# Patient Record
Sex: Male | Born: 1970 | Race: White | Hispanic: No | Marital: Married | Smoking: Never smoker
Health system: Southern US, Community
[De-identification: ages and names within clinical notes are randomized; demographics above are authoritative.]

## PROBLEM LIST (undated history)

## (undated) DIAGNOSIS — G4730 Sleep apnea, unspecified: Secondary | ICD-10-CM

## (undated) DIAGNOSIS — I10 Essential (primary) hypertension: Secondary | ICD-10-CM

---

## 2005-10-24 ENCOUNTER — Emergency Department (HOSPITAL_COMMUNITY): Admission: EM | Admit: 2005-10-24 | Discharge: 2005-10-25 | Payer: Self-pay | Admitting: Emergency Medicine

## 2007-11-05 ENCOUNTER — Emergency Department (HOSPITAL_COMMUNITY): Admission: EM | Admit: 2007-11-05 | Discharge: 2007-11-05 | Payer: Self-pay | Admitting: Family Medicine

## 2009-11-20 ENCOUNTER — Emergency Department (HOSPITAL_COMMUNITY): Admission: EM | Admit: 2009-11-20 | Discharge: 2009-11-20 | Payer: Self-pay | Admitting: Emergency Medicine

## 2011-10-26 ENCOUNTER — Institutional Professional Consult (permissible substitution): Payer: Self-pay | Admitting: Pulmonary Disease

## 2012-03-15 ENCOUNTER — Emergency Department: Payer: Self-pay | Admitting: Emergency Medicine

## 2015-07-29 ENCOUNTER — Encounter (HOSPITAL_BASED_OUTPATIENT_CLINIC_OR_DEPARTMENT_OTHER): Payer: Self-pay

## 2015-07-29 ENCOUNTER — Emergency Department (HOSPITAL_BASED_OUTPATIENT_CLINIC_OR_DEPARTMENT_OTHER)
Admission: EM | Admit: 2015-07-29 | Discharge: 2015-07-29 | Disposition: A | Discharge status: Home / Self Care | Payer: BLUE CROSS/BLUE SHIELD | Attending: Emergency Medicine | Admitting: Emergency Medicine

## 2015-07-29 ENCOUNTER — Emergency Department (HOSPITAL_BASED_OUTPATIENT_CLINIC_OR_DEPARTMENT_OTHER): Payer: BLUE CROSS/BLUE SHIELD

## 2015-07-29 DIAGNOSIS — S3992XA Unspecified injury of lower back, initial encounter: Secondary | ICD-10-CM | POA: Diagnosis present

## 2015-07-29 DIAGNOSIS — X58XXXA Exposure to other specified factors, initial encounter: Secondary | ICD-10-CM | POA: Diagnosis not present

## 2015-07-29 DIAGNOSIS — S46911A Strain of unspecified muscle, fascia and tendon at shoulder and upper arm level, right arm, initial encounter: Secondary | ICD-10-CM | POA: Diagnosis not present

## 2015-07-29 DIAGNOSIS — Y998 Other external cause status: Secondary | ICD-10-CM | POA: Insufficient documentation

## 2015-07-29 DIAGNOSIS — I10 Essential (primary) hypertension: Secondary | ICD-10-CM | POA: Insufficient documentation

## 2015-07-29 DIAGNOSIS — Y9234 Swimming pool (public) as the place of occurrence of the external cause: Secondary | ICD-10-CM | POA: Insufficient documentation

## 2015-07-29 DIAGNOSIS — S29001A Unspecified injury of muscle and tendon of front wall of thorax, initial encounter: Secondary | ICD-10-CM | POA: Diagnosis not present

## 2015-07-29 DIAGNOSIS — Y9389 Activity, other specified: Secondary | ICD-10-CM | POA: Diagnosis not present

## 2015-07-29 DIAGNOSIS — S46811A Strain of other muscles, fascia and tendons at shoulder and upper arm level, right arm, initial encounter: Secondary | ICD-10-CM

## 2015-07-29 MED ORDER — CYCLOBENZAPRINE HCL 5 MG PO TABS: 5.0000 mg | ORAL_TABLET | Freq: Three times a day (TID) | ORAL | Status: DC | PRN

## 2015-07-29 MED ORDER — CYCLOBENZAPRINE HCL 10 MG PO TABS
5.0000 mg | ORAL_TABLET | Freq: Once | ORAL | Status: AC
  Administered 2015-07-29: 5 mg via ORAL
  Filled 2015-07-29: qty 1

## 2015-07-29 NOTE — ED Notes (Signed)
Pt c/o rt upper back pain x2day

## 2015-07-29 NOTE — Discharge Instructions (Signed)
Continue motrin 600 mg every 6 hrs for pain.   Take flexeril for muscle spasms.   Follow up with your doctor regarding your blood pressure.   Return to ER if you have severe pain, shortness of breath.

## 2015-07-29 NOTE — ED Notes (Signed)
MD at bedside. 

## 2015-07-29 NOTE — ED Provider Notes (Signed)
CSN: [id number]     Arrival date & time 07/29/15  1909 History  This chart was scribed for Richardean Canal, MD by Lyndel Safe, ED Scribe. This patient was seen in room MHT13/MHT13 and the patient's care was started 7:48 PM.   Chief Complaint  Patient presents with  . Back Pain   The history is provided by the patient. No language interpreter was used.   HPI Comments: Nicolas May is a 44 y.o. male who presents to the Emergency Department complaining of constant, moderate right-sided mid back pain and rib cage pain onset 2 days ago which he describes as an uncomfortable feeling. He reports 2 days ago he was at the pool playing with his son and tossing him up in the air.  He has taken Motrin with no relief. Denies trouble breathing, SOB, abdominal pain. States that his BP usually runs high at the doctor's office but he is not on blood pressure meds. Denies cardiac problems or CAD.   History reviewed. No pertinent past medical history. History reviewed. No pertinent past surgical history. No family history on file. History  Substance Use Topics  . Smoking status: Never Smoker   . Smokeless tobacco: Not on file  . Alcohol Use: Yes    Review of Systems  Respiratory: Negative for shortness of breath.   Gastrointestinal: Negative for abdominal pain.  Musculoskeletal: Positive for back pain.  All other systems reviewed and are negative.  Allergies  Review of patient's allergies indicates no known allergies.  Home Medications   Prior to Admission medications   Not on File   BP 164/103 mmHg  Pulse 62  Temp(Src) 98.1 F (36.7 C)  Resp 18  Ht 5\' 11"  (1.803 m)  Wt 238 lb (107.956 kg)  BMI 33.21 kg/m2  SpO2 100% Physical Exam  Constitutional: He appears well-developed and well-nourished. No distress.  HENT:  Head: Normocephalic.  Eyes: Conjunctivae are normal. Right eye exhibits no discharge. Left eye exhibits no discharge. No scleral icterus.  Neck: No JVD present.  Cardiovascular:  Normal rate, regular rhythm and normal heart sounds.   Pulmonary/Chest: Effort normal and breath sounds normal. No respiratory distress. He has no wheezes.  Reproducible tenderness R chest and R anterior trapezius area.   Musculoskeletal:  No midline spinal tenderness   Neurological: He is alert. Coordination normal.  Skin: Skin is warm. No rash noted. No erythema. No pallor.  No vesicular rash on chest or back   Psychiatric: He has a normal mood and affect. His behavior is normal.  Nursing note and vitals reviewed.   ED Course  Procedures  DIAGNOSTIC STUDIES: Oxygen Saturation is 100% on RA, normal by my interpretation.    COORDINATION OF CARE: 7:51 PM Discussed treatment plan which includes to order a chest Xray with pt. Pt acknowledges and agrees to plan.   Labs Review Labs Reviewed - No data to display  Imaging Review Dg Chest 2 View  07/29/2015   CLINICAL DATA:  Right rib pain for 2 days. No known injury. Initial encounter.  EXAM: CHEST  2 VIEW  COMPARISON:  None.  FINDINGS: Heart size and mediastinal contours are within normal limits. Both lungs are clear. Visualized skeletal structures are unremarkable.  IMPRESSION: Negative exam.   Electronically Signed   By: Drusilla Kanner M.D.   On: 07/29/2015 20:11     EKG Interpretation None      MDM   Final diagnoses:  None    Nicolas May is a 44 y.o.  male here with R sided back pain, chest pain. Reproducible, likely muscle strain from swimming. Not pleuritic pain so I doubt PE. I doubt ACS. CXR showed no obvious mass. Patient hypertensive likely from pain. Has hx of white coat hypertension. No signs of hypertensive emergency. Will dc home with motrin, flexeril. Will have him see PCP to f/u hypertension.     I personally performed the services described in this documentation, which was scribed in my presence. The recorded information has been reviewed and is accurate.    Richardean Canal, MD 07/29/15 2027

## 2018-04-05 ENCOUNTER — Emergency Department (HOSPITAL_BASED_OUTPATIENT_CLINIC_OR_DEPARTMENT_OTHER)
Admission: EM | Admit: 2018-04-05 | Discharge: 2018-04-06 | Disposition: A | Discharge status: Home / Self Care | Payer: BLUE CROSS/BLUE SHIELD | Attending: Emergency Medicine | Admitting: Emergency Medicine

## 2018-04-05 ENCOUNTER — Encounter (HOSPITAL_BASED_OUTPATIENT_CLINIC_OR_DEPARTMENT_OTHER): Payer: Self-pay | Admitting: *Deleted

## 2018-04-05 ENCOUNTER — Emergency Department (HOSPITAL_BASED_OUTPATIENT_CLINIC_OR_DEPARTMENT_OTHER): Payer: BLUE CROSS/BLUE SHIELD

## 2018-04-05 ENCOUNTER — Other Ambulatory Visit: Payer: Self-pay

## 2018-04-05 DIAGNOSIS — Z79899 Other long term (current) drug therapy: Secondary | ICD-10-CM | POA: Diagnosis not present

## 2018-04-05 DIAGNOSIS — I10 Essential (primary) hypertension: Secondary | ICD-10-CM | POA: Diagnosis not present

## 2018-04-05 DIAGNOSIS — R1032 Left lower quadrant pain: Secondary | ICD-10-CM | POA: Insufficient documentation

## 2018-04-05 DIAGNOSIS — R109 Unspecified abdominal pain: Secondary | ICD-10-CM

## 2018-04-05 HISTORY — DX: Essential (primary) hypertension: I10

## 2018-04-05 HISTORY — DX: Sleep apnea, unspecified: G47.30

## 2018-04-05 NOTE — ED Triage Notes (Signed)
Pt c/o left flank pain x 2 days denies n/v/d

## 2018-04-06 ENCOUNTER — Encounter (HOSPITAL_BASED_OUTPATIENT_CLINIC_OR_DEPARTMENT_OTHER): Payer: Self-pay | Admitting: Emergency Medicine

## 2018-04-06 LAB — HEPATIC FUNCTION PANEL
ALT: 21 U/L (ref 17–63)
AST: 27 U/L (ref 15–41)
Albumin: 4.5 g/dL (ref 3.5–5.0)
Alkaline Phosphatase: 92 U/L (ref 38–126)
Total Bilirubin: 0.7 mg/dL (ref 0.3–1.2)
Total Protein: 7.7 g/dL (ref 6.5–8.1)

## 2018-04-06 LAB — CBC WITH DIFFERENTIAL/PLATELET
BASOS PCT: 0 %
Basophils Absolute: 0 10*3/uL (ref 0.0–0.1)
EOS ABS: 0.3 10*3/uL (ref 0.0–0.7)
Eosinophils Relative: 2 %
HCT: 44.6 % (ref 39.0–52.0)
Hemoglobin: 15.4 g/dL (ref 13.0–17.0)
Lymphocytes Relative: 14 %
Lymphs Abs: 1.8 10*3/uL (ref 0.7–4.0)
MCH: 30 pg (ref 26.0–34.0)
MCHC: 34.5 g/dL (ref 30.0–36.0)
MCV: 86.9 fL (ref 78.0–100.0)
MONO ABS: 1 10*3/uL (ref 0.1–1.0)
Monocytes Relative: 8 %
Neutro Abs: 10.4 10*3/uL — ABNORMAL HIGH (ref 1.7–7.7)
Neutrophils Relative %: 77 %
Platelets: 249 10*3/uL (ref 150–400)
RBC: 5.13 MIL/uL (ref 4.22–5.81)
RDW: 12.5 % (ref 11.5–15.5)
WBC: 13.5 10*3/uL — ABNORMAL HIGH (ref 4.0–10.5)

## 2018-04-06 LAB — URINALYSIS, ROUTINE W REFLEX MICROSCOPIC
BILIRUBIN URINE: NEGATIVE
GLUCOSE, UA: NEGATIVE mg/dL
HGB URINE DIPSTICK: NEGATIVE
KETONES UR: NEGATIVE mg/dL
Leukocytes, UA: NEGATIVE
Nitrite: NEGATIVE
Protein, ur: NEGATIVE mg/dL
Specific Gravity, Urine: <1.005 — ABNORMAL LOW (ref 1.005–1.030)
pH: 7 (ref 5.0–8.0)

## 2018-04-06 LAB — BASIC METABOLIC PANEL
ANION GAP: 10 (ref 5–15)
BUN: 15 mg/dL (ref 6–20)
CO2: 25 mmol/L (ref 22–32)
Calcium: 9.2 mg/dL (ref 8.9–10.3)
Chloride: 100 mmol/L — ABNORMAL LOW (ref 101–111)
Creatinine, Ser: 1 mg/dL (ref 0.61–1.24)
Glucose, Bld: 128 mg/dL — ABNORMAL HIGH (ref 65–99)
Potassium: 3.8 mmol/L (ref 3.5–5.1)
Sodium: 135 mmol/L (ref 135–145)

## 2018-04-06 LAB — LIPASE, BLOOD: LIPASE: 30 U/L (ref 11–51)

## 2018-04-06 MED ORDER — DICLOFENAC SODIUM ER 100 MG PO TB24: 100.0000 mg | ORAL_TABLET | Freq: Every day | ORAL | 0 refills | Status: AC

## 2018-04-06 MED ORDER — KETOROLAC TROMETHAMINE 30 MG/ML IJ SOLN
15.0000 mg | Freq: Once | INTRAMUSCULAR | Status: AC
  Administered 2018-04-06: 15 mg via INTRAVENOUS
  Filled 2018-04-06: qty 1

## 2018-04-06 NOTE — ED Provider Notes (Signed)
[facility] EMERGENCY DEPARTMENT Provider Note   CSN: [id number] Arrival date & time: 04/05/18  2228     History   Chief Complaint Chief Complaint  Patient presents with  . Flank Pain    HPI Nicolas May is a 47 y.o. male.  The history is provided by the patient.  Flank Pain  This is a new problem. The current episode started more than 2 days ago. Episode frequency: intermittently but worse today  The problem has been gradually worsening. Pertinent negatives include no chest pain, no headaches and no shortness of breath. Nothing aggravates the symptoms. Nothing relieves the symptoms. He has tried nothing for the symptoms. The treatment provided no relief.  States it started intermittently several days ago now worse today.  No f/c/r. No cough, no chest pain, no shortness of breath.  It is not made worse by eating or movement or deep breathing.  No n/v/d.    Past Medical History:  Diagnosis Date  . Hypertension   . Sleep apnea     There are no active problems to display for this patient.   History reviewed. No pertinent surgical history.      Home Medications    Prior to Admission medications   Medication Sig Start Date End Date Taking? Authorizing Provider  lisinopril (PRINIVIL,ZESTRIL) 20 MG tablet Take 20 mg by mouth daily.   Yes [provider]  cyclobenzaprine (FLEXERIL) 5 MG tablet Take 1 tablet (5 mg total) by mouth 3 (three) times daily as needed for muscle spasms. 07/29/15   Charlynne Pander, MD  Diclofenac Sodium CR (VOLTAREN-XR) 100 MG 24 hr tablet Take 1 tablet (100 mg total) by mouth daily. 04/06/18   Nyelah Emmerich, MD    Family History History reviewed. No pertinent family history.  Social History Social History   Tobacco Use  . Smoking status: Never Smoker  . Smokeless tobacco: Never Used  Substance Use Topics  . Alcohol use: Yes  . Drug use: Never     Allergies   Patient has no known allergies.   Review of  Systems Review of Systems  Constitutional: Negative for activity change, appetite change, diaphoresis and fever.  HENT: Negative for sore throat and voice change.   Respiratory: Negative for cough, chest tightness and shortness of breath.   Cardiovascular: Negative for chest pain, palpitations and leg swelling.  Gastrointestinal: Negative for anal bleeding, blood in stool, constipation, diarrhea, nausea, rectal pain and vomiting.  Genitourinary: Positive for flank pain. Negative for dysuria.  Neurological: Negative for speech difficulty, weakness and headaches.  All other systems reviewed and are negative.    Physical Exam Updated Vital Signs BP 124/63 (BP Location: Left Arm)   Pulse 75   Temp 97.8 F (36.6 C) (Oral)   Resp 16   Ht 5\' 11"  (1.803 m)   Wt 97.5 kg (215 lb)   SpO2 98%   BMI 29.99 kg/m   Physical Exam  Constitutional: He is oriented to person, place, and time. He appears well-developed and well-nourished. No distress.  HENT:  Head: Normocephalic and atraumatic.  Mouth/Throat: No oropharyngeal exudate.  Eyes: Pupils are equal, round, and reactive to light. Conjunctivae are normal.  Neck: Normal range of motion. Neck supple.  Cardiovascular: Normal rate, regular rhythm, normal heart sounds and intact distal pulses.  Pulmonary/Chest: Effort normal and breath sounds normal. No stridor. He has no wheezes. He has no rales. He exhibits no tenderness.  Abdominal: Soft. He exhibits no mass. There is no  hepatosplenomegaly. There is no tenderness. There is no rigidity, no rebound, no guarding, no CVA tenderness, no tenderness at McBurney's point and negative Murphy's sign.    Musculoskeletal: Normal range of motion. He exhibits no tenderness.  Neurological: He is alert and oriented to person, place, and time.  Skin: Skin is warm and dry. Capillary refill takes less than 2 seconds.  Psychiatric: His mood appears anxious.     ED Treatments / Results  Labs (all labs  ordered are listed, but only abnormal results are displayed) Results for orders placed or performed during the hospital encounter of 04/05/18  Urinalysis, Routine w reflex microscopic  Result Value Ref Range   Color, Urine YELLOW YELLOW   APPearance CLEAR CLEAR   Specific Gravity, Urine <1.005 (L) 1.005 - 1.030   pH 7.0 5.0 - 8.0   Glucose, UA NEGATIVE NEGATIVE mg/dL   Hgb urine dipstick NEGATIVE NEGATIVE   Bilirubin Urine NEGATIVE NEGATIVE   Ketones, ur NEGATIVE NEGATIVE mg/dL   Protein, ur NEGATIVE NEGATIVE mg/dL   Nitrite NEGATIVE NEGATIVE   Leukocytes, UA NEGATIVE NEGATIVE  CBC with Differential/Platelet  Result Value Ref Range   WBC 13.5 (H) 4.0 - 10.5 K/uL   RBC 5.13 4.22 - 5.81 MIL/uL   Hemoglobin 15.4 13.0 - 17.0 g/dL   HCT 69.644.6 29.539.0 - 28.452.0 %   MCV 86.9 78.0 - 100.0 fL   MCH 30.0 26.0 - 34.0 pg   MCHC 34.5 30.0 - 36.0 g/dL   RDW 13.212.5 44.011.5 - 10.215.5 %   Platelets 249 150 - 400 K/uL   Neutrophils Relative % 77 %   Neutro Abs 10.4 (H) 1.7 - 7.7 K/uL   Lymphocytes Relative 14 %   Lymphs Abs 1.8 0.7 - 4.0 K/uL   Monocytes Relative 8 %   Monocytes Absolute 1.0 0.1 - 1.0 K/uL   Eosinophils Relative 2 %   Eosinophils Absolute 0.3 0.0 - 0.7 K/uL   Basophils Relative 0 %   Basophils Absolute 0.0 0.0 - 0.1 K/uL  Basic metabolic panel  Result Value Ref Range   Sodium 135 135 - 145 mmol/L   Potassium 3.8 3.5 - 5.1 mmol/L   Chloride 100 (L) 101 - 111 mmol/L   CO2 25 22 - 32 mmol/L   Glucose, Bld 128 (H) 65 - 99 mg/dL   BUN 15 6 - 20 mg/dL   Creatinine, Ser 7.251.00 0.61 - 1.24 mg/dL   Calcium 9.2 8.9 - 36.610.3 mg/dL   GFR calc non Af Amer >60 >60 mL/min   GFR calc Af Amer >60 >60 mL/min   Anion gap 10 5 - 15  Hepatic function panel  Result Value Ref Range   Total Protein 7.7 6.5 - 8.1 g/dL   Albumin 4.5 3.5 - 5.0 g/dL   AST 27 15 - 41 U/L   ALT 21 17 - 63 U/L   Alkaline Phosphatase 92 38 - 126 U/L   Total Bilirubin 0.7 0.3 - 1.2 mg/dL   Bilirubin, Direct <4.4<0.1 (L) 0.1 - 0.5  mg/dL   Indirect Bilirubin NOT CALCULATED 0.3 - 0.9 mg/dL  Lipase, blood  Result Value Ref Range   Lipase 30 11 - 51 U/L   Ct Renal Stone Study  Result Date: 04/06/2018 CLINICAL DATA:  Initial evaluation for acute left flank pain. Sue sleeve widely patent flow King aortic arch inflamed EXAM: CT ABDOMEN AND PELVIS WITHOUT CONTRAST TECHNIQUE: Multidetector CT imaging of the abdomen and pelvis was performed following the standard protocol without IV  contrast. COMPARISON:  None. FINDINGS: Lower chest: Mild scattered subsegmental atelectatic changes noted within the visualized lungs. 5 mm right middle lobe nodule noted (series 4, image 11), indeterminate. Visualized lungs are otherwise clear. Hepatobiliary: Limited noncontrast evaluation of the liver is unremarkable. Gallbladder within normal limits. No biliary dilatation. Pancreas: There is mild hazy fat stranding about the posterior pancreatic body, which may reflect sequelae of acute pancreatitis (series 3, image 49). Pancreas otherwise within normal limits. No abnormal pancreatic ductal dilatation. Spleen: Spleen within normal limits. Adrenals/Urinary Tract: Adrenal glands are normal. Kidneys equal in size without evidence for nephrolithiasis or hydronephrosis. No radiopaque calculi seen along the course of either renal collecting system. No hydroureter. 18 mm exophytic cyst noted extending from the lower pole of the right kidney. Bladder within normal limits. No layering stones within the bladder lumen. Stomach/Bowel: Stomach within normal limits. No evidence for bowel obstruction. Appendix normal. No acute inflammatory changes seen about the bowels. Vascular/Lymphatic: Intra-abdominal aorta of normal caliber. No pathologically enlarged intra-abdominal or pelvic lymph nodes. Reproductive: Prostate normal. Other: No free air or fluid. Musculoskeletal: No acute osseus abnormality. No worrisome lytic or blastic osseous lesions. 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 IMPRESSION: 1. Mild hazy inflammatory stranding about the posterior pancreatic body, suspicious for possible acute pancreatitis. Correlation with serum lipase recommended. 2. No other acute intra-abdominal or pelvic process identified. 3. No CT evidence for nephrolithiasis or obstructive uropathy. Electronically Signed   By: Rise Mu M.D.   On: 04/06/2018 00:29    Radiology Ct Renal Stone Study  Result Date: 04/06/2018 CLINICAL DATA:  Initial evaluation for acute left flank pain. Sue sleeve widely patent flow King aortic arch inflamed EXAM: CT ABDOMEN AND PELVIS WITHOUT CONTRAST TECHNIQUE: Multidetector CT imaging of the abdomen and pelvis was performed following the standard protocol without IV contrast. COMPARISON:  None. FINDINGS: Lower chest: Mild scattered subsegmental atelectatic changes noted within the visualized lungs. 5 mm right middle lobe nodule noted (series 4, image 11), indeterminate. Visualized lungs are otherwise clear. Hepatobiliary: Limited noncontrast evaluation of the liver is unremarkable. Gallbladder within normal limits. No biliary dilatation. Pancreas: There is mild hazy fat stranding about the posterior pancreatic body, which may reflect sequelae of acute pancreatitis (series 3, image 49). Pancreas otherwise within normal limits. No abnormal pancreatic ductal dilatation. Spleen: Spleen within normal limits. Adrenals/Urinary Tract: Adrenal glands are normal. Kidneys equal in size without evidence for nephrolithiasis or hydronephrosis. No radiopaque calculi seen along the course of either renal collecting system. No hydroureter. 18 mm exophytic cyst noted extending from the lower pole of the right kidney. Bladder within normal limits. No layering stones within the bladder lumen. Stomach/Bowel: Stomach within normal limits. No evidence for bowel obstruction. Appendix normal. No acute  inflammatory changes seen about the bowels. Vascular/Lymphatic: Intra-abdominal aorta of normal caliber. No pathologically enlarged intra-abdominal or pelvic lymph nodes. Reproductive: Prostate normal. Other: No free air or fluid. Musculoskeletal: No acute osseus abnormality. No worrisome lytic or blastic osseous lesions. Unilateral left-sided pars defect noted at L5. 5 cm intramuscular lipoma noted within the posterior right upper thigh. IMPRESSION: 1. Mild hazy inflammatory stranding about the posterior pancreatic body, suspicious for possible acute pancreatitis. Correlation with serum lipase recommended. 2. No other acute intra-abdominal or pelvic process identified. 3. No CT evidence for nephrolithiasis or obstructive uropathy. Electronically Signed   By: Rise Mu M.D.   On: 04/06/2018 00:29    Procedures Procedures (  including critical care time)  Medications Ordered in ED Medications  ketorolac (TORADOL) 30 MG/ML injection 15 mg (15 mg Intravenous Given 04/06/18 0046)     I suspect this is MSK pain as it seem to be in the area of the obliques.  He states sometimes movement helps.  It is not in the thoracic cavity.  It is not worse with inspiration.  He has no cough or hemoptysis.  No leg pain.  No long travel.  No n/v/d.  He is non tender on exam with normal vital signs.    PERC negative wells 0, very highly doubt PE as the pain is low and the symptoms are in the abdomen.    Meals do not affect it and the patient endorses a low fat diet.    I have had a long discussion with the patient regarding the CT scan report and stated to the patient that while there was a concern for trace inflammation near the tail of the pancreas his blood work did not show signs of this and the pain is not consistent with this.    I reassured the patient multiple times that all lifethreatening conditions had been excluded with labs and CT scan. We give a trial of NSAIDs for pain and I encouraged the  patient to continue his healthy and active life style.      Final Clinical Impressions(s) / ED Diagnoses   Final diagnoses:  Flank pain   Likely MSK in nature. Recheck with your family doctor this week.  Return for fevers >100.4 unrelieved by medication, shortness of breath, chest pain, hemoptysis, intractable vomiting, or diarrhea, abdominal pain, Inability to tolerate liquids or food, cough, altered mental status or any concerns. No signs of systemic illness or infection. The patient is nontoxic-appearing on exam and vital signs are within normal limits.   I have reviewed the triage vital signs and the nursing notes. Pertinent labs &imaging results that were available during my care of the patient were reviewed by me and considered in my medical decision making (see chart for details).  After history, exam, and medical workup I feel the patient has been appropriately medically screened and is safe for discharge home. Pertinent diagnoses were discussed with the patient. Patient was given return precautions.   ED Discharge Orders        Ordered    Diclofenac Sodium CR (VOLTAREN-XR) 100 MG 24 hr tablet  Daily     04/06/18 0149       Aerik Polan, MD 04/06/18 0209

## 2019-12-23 IMAGING — CT CT RENAL STONE PROTOCOL
2 of 4 series · 16 of 46 positions shown, 18 images · non-contrast
Comparison: None.

CLINICAL DATA: Initial evaluation for acute left flank pain. Tiger
sleeve widely patent flow Matayoshi aortic arch inflamed

EXAM:
CT ABDOMEN AND PELVIS WITHOUT CONTRAST
TECHNIQUE: Multidetector CT imaging of the abdomen and pelvis was performed
following the standard protocol without IV contrast.

[Series 3: axial st · axial · 0.84mm/px · z∈[-492,+52]mm · 13 of 121 slices shown, 15 images]
[im 6/121  soft-tissue]
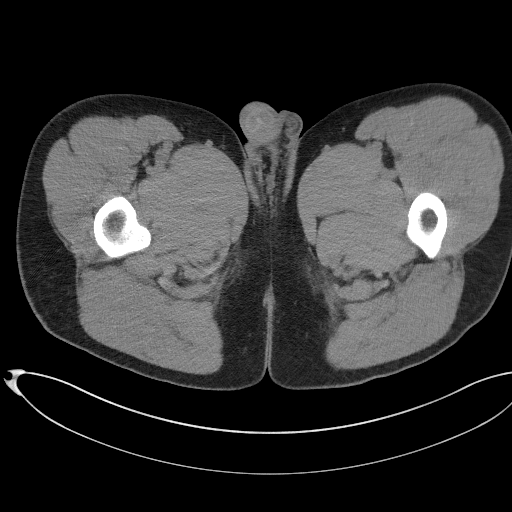
[im 6/121  bone]
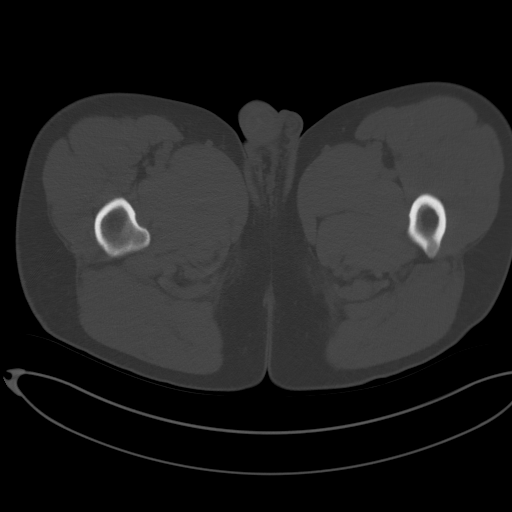
[im 16/121  soft-tissue]
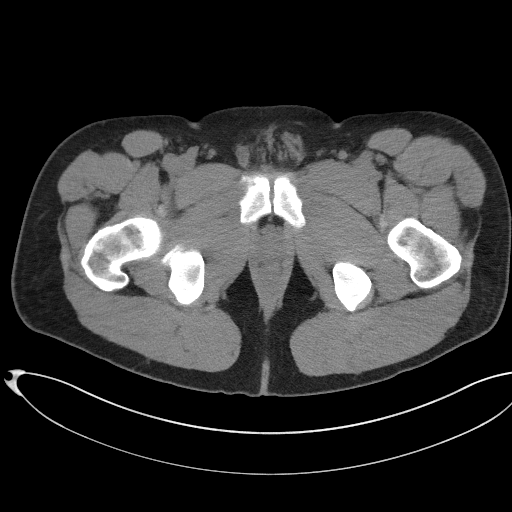
[im 27/121  soft-tissue]
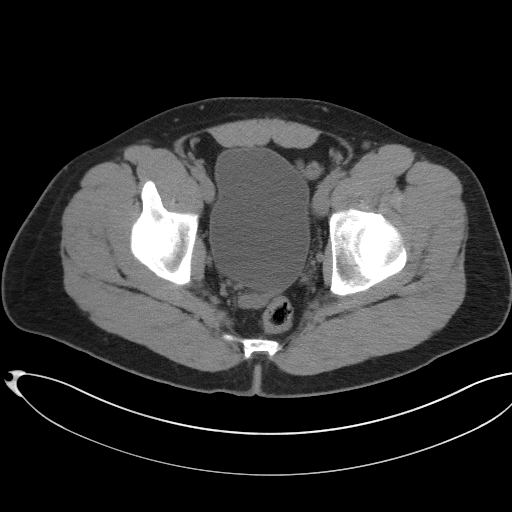
[im 32/121  soft-tissue]
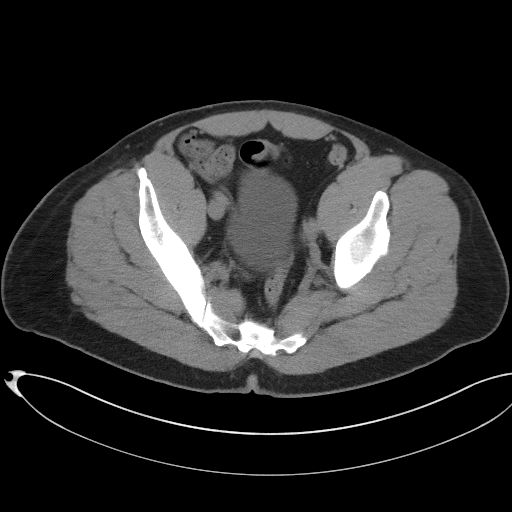
[im 42/121  soft-tissue]
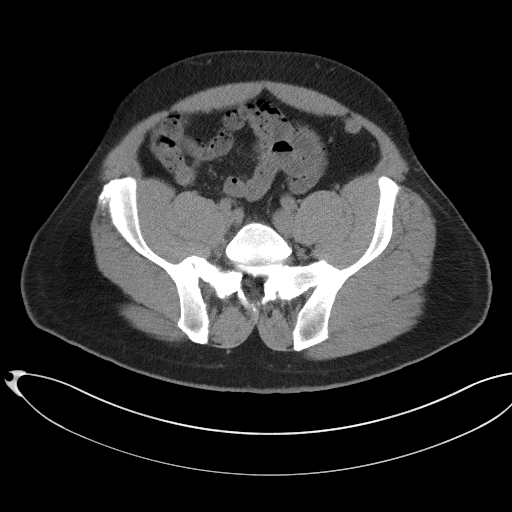
[im 53/121  soft-tissue]
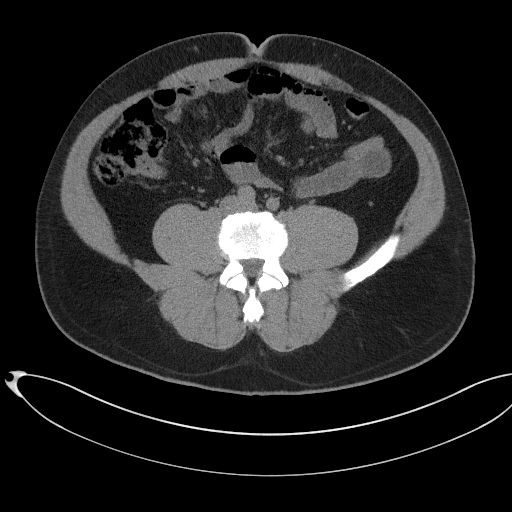
[im 63/121  soft-tissue]
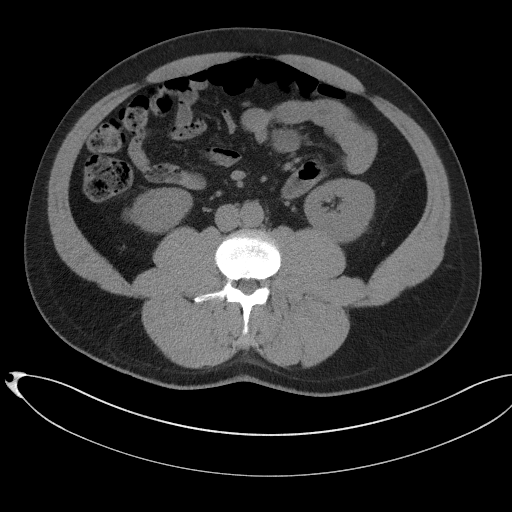
[im 68/121  soft-tissue]
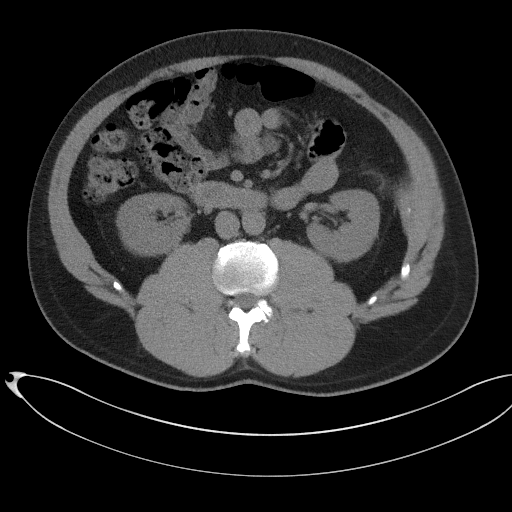
[im 79/121  soft-tissue]
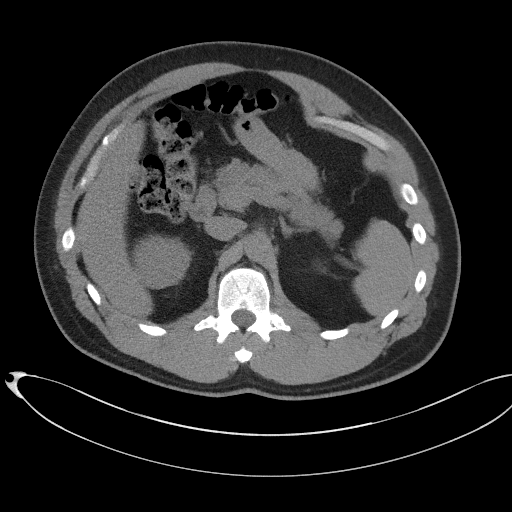
[im 79/121  bone]
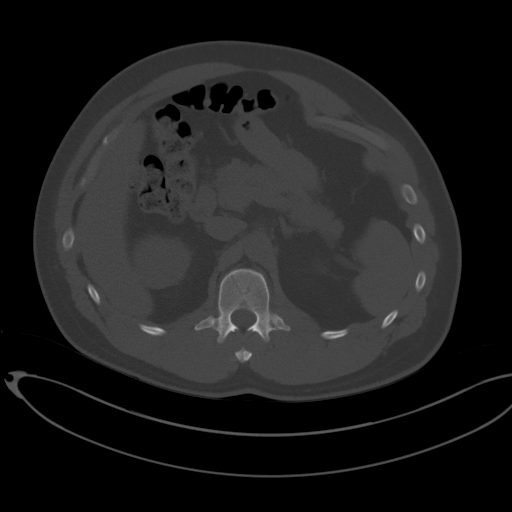
[im 89/121  soft-tissue]
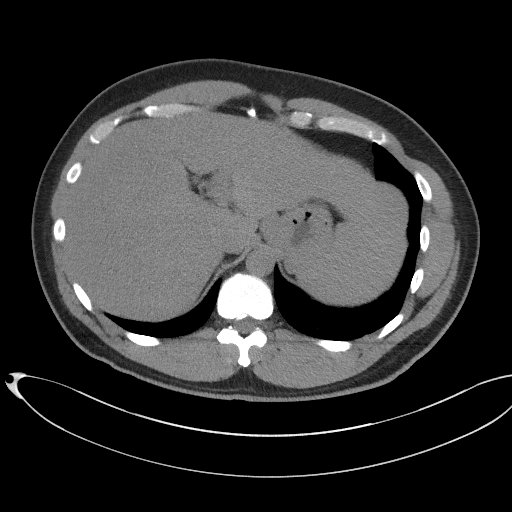
[im 94/121  soft-tissue]
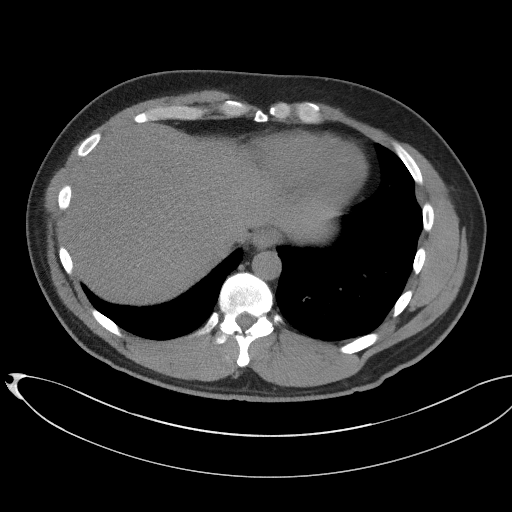
[im 105/121  soft-tissue]
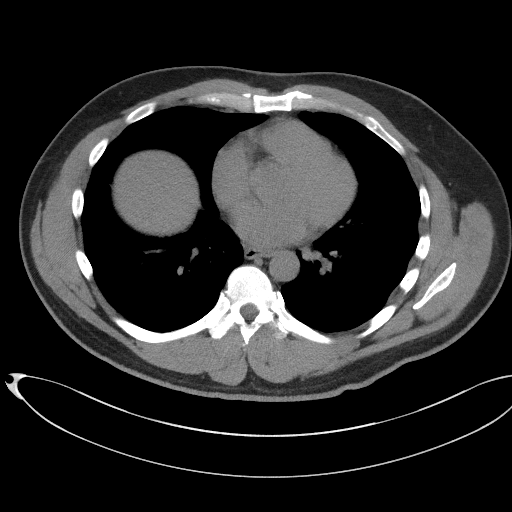
[im 115/121  soft-tissue]
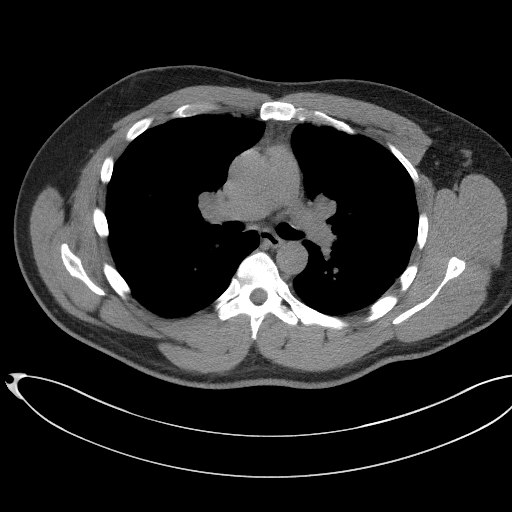

[Series 5: coronal st · coronal · 1.01mm/px · 3 of 96 slices shown]
[im 32/96  soft-tissue]
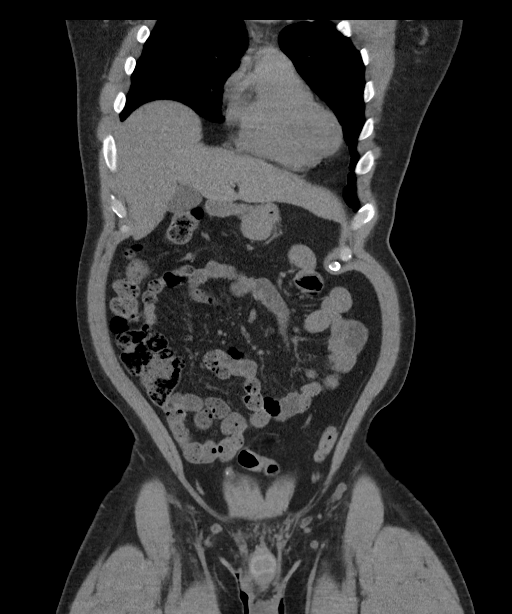
[im 43/96  soft-tissue]
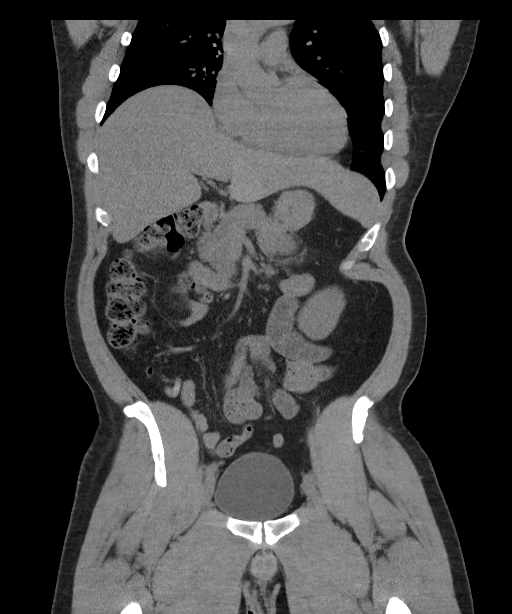
[im 53/96  soft-tissue]
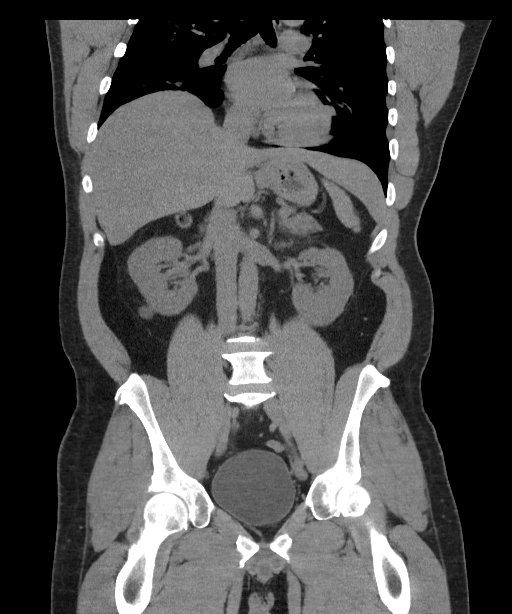

[16 of 46 positions shown; findings below may reference images not displayed]

FINDINGS: Lower chest: Mild scattered subsegmental atelectatic changes noted
within the visualized lungs. 5 mm right middle lobe nodule noted
(series 4, image 11), indeterminate. Visualized lungs are otherwise
clear.

Hepatobiliary: Limited noncontrast evaluation of the liver is
unremarkable. Gallbladder within normal limits. No biliary
dilatation.

Pancreas: There is mild hazy fat stranding about the posterior
pancreatic body, which may reflect sequelae of acute pancreatitis
(series 3, image 49). Pancreas otherwise within normal limits. No
abnormal pancreatic ductal dilatation.

Spleen: Spleen within normal limits.

Adrenals/Urinary Tract: Adrenal glands are normal. Kidneys equal in
size without evidence for nephrolithiasis or hydronephrosis. No
radiopaque calculi seen along the course of either renal collecting
system. No hydroureter. 18 mm exophytic cyst noted extending from
the lower pole of the right kidney. Bladder within normal limits. No
layering stones within the bladder lumen.

Stomach/Bowel: Stomach within normal limits. No evidence for bowel
obstruction. Appendix normal. No acute inflammatory changes seen
about the bowels.

Vascular/Lymphatic: Intra-abdominal aorta of normal caliber. No
pathologically enlarged intra-abdominal or pelvic lymph nodes.

Reproductive: Prostate normal.

Other: No free air or fluid.

Musculoskeletal: No acute osseus abnormality. No worrisome lytic or
blastic osseous lesions. Unilateral left-sided pars defect noted at
L5. 5 cm intramuscular lipoma noted within the posterior right upper
thigh.
IMPRESSION: 1. Mild hazy inflammatory stranding about the posterior pancreatic
body, suspicious for possible acute pancreatitis. Correlation with
serum lipase recommended.
2. No other acute intra-abdominal or pelvic process identified.
3. No CT evidence for nephrolithiasis or obstructive uropathy.

## 2020-01-29 ENCOUNTER — Emergency Department (HOSPITAL_BASED_OUTPATIENT_CLINIC_OR_DEPARTMENT_OTHER): Payer: BLUE CROSS/BLUE SHIELD

## 2020-01-29 ENCOUNTER — Encounter (HOSPITAL_BASED_OUTPATIENT_CLINIC_OR_DEPARTMENT_OTHER): Payer: Self-pay | Admitting: *Deleted

## 2020-01-29 ENCOUNTER — Emergency Department (HOSPITAL_BASED_OUTPATIENT_CLINIC_OR_DEPARTMENT_OTHER)
Admission: EM | Admit: 2020-01-29 | Discharge: 2020-01-29 | Disposition: A | Discharge status: Home / Self Care | Payer: BLUE CROSS/BLUE SHIELD | Attending: Emergency Medicine | Admitting: Emergency Medicine

## 2020-01-29 ENCOUNTER — Other Ambulatory Visit: Payer: Self-pay

## 2020-01-29 DIAGNOSIS — I10 Essential (primary) hypertension: Secondary | ICD-10-CM | POA: Insufficient documentation

## 2020-01-29 DIAGNOSIS — R072 Precordial pain: Secondary | ICD-10-CM | POA: Insufficient documentation

## 2020-01-29 DIAGNOSIS — R0789 Other chest pain: Secondary | ICD-10-CM | POA: Diagnosis present

## 2020-01-29 LAB — COMPREHENSIVE METABOLIC PANEL
ALT: 30 U/L (ref 0–44)
AST: 26 U/L (ref 15–41)
Albumin: 4.5 g/dL (ref 3.5–5.0)
Alkaline Phosphatase: 56 U/L (ref 38–126)
Anion gap: 8 (ref 5–15)
BUN: 17 mg/dL (ref 6–20)
CO2: 28 mmol/L (ref 22–32)
Calcium: 9.4 mg/dL (ref 8.9–10.3)
Chloride: 102 mmol/L (ref 98–111)
Creatinine, Ser: 1.13 mg/dL (ref 0.61–1.24)
GFR calc Af Amer: >60 mL/min (ref >60)
GFR calc non Af Amer: >60 mL/min (ref >60)
Glucose, Bld: 108 mg/dL — ABNORMAL HIGH (ref 70–99)
Potassium: 3.5 mmol/L (ref 3.5–5.1)
Sodium: 138 mmol/L (ref 135–145)
Total Bilirubin: 1.2 mg/dL (ref 0.3–1.2)
Total Protein: 7.6 g/dL (ref 6.5–8.1)

## 2020-01-29 LAB — TROPONIN I (HIGH SENSITIVITY)
Troponin I (High Sensitivity): 5 ng/L (ref <18)
Troponin I (High Sensitivity): 5 ng/L (ref <18)

## 2020-01-29 LAB — CBC WITH DIFFERENTIAL/PLATELET
Abs Immature Granulocytes: 0.1 10*3/uL — ABNORMAL HIGH (ref 0.00–0.07)
Basophils Absolute: 0.1 10*3/uL (ref 0.0–0.1)
Basophils Relative: 0 %
Eosinophils Absolute: 0.3 10*3/uL (ref 0.0–0.5)
Eosinophils Relative: 2 %
HCT: 47.9 % (ref 39.0–52.0)
Hemoglobin: 16 g/dL (ref 13.0–17.0)
Immature Granulocytes: 1 %
Lymphocytes Relative: 21 %
Lymphs Abs: 2.5 10*3/uL (ref 0.7–4.0)
MCH: 29.6 pg (ref 26.0–34.0)
MCHC: 33.4 g/dL (ref 30.0–36.0)
MCV: 88.7 fL (ref 80.0–100.0)
Monocytes Absolute: 0.9 10*3/uL (ref 0.1–1.0)
Monocytes Relative: 7 %
Neutro Abs: 8.4 10*3/uL — ABNORMAL HIGH (ref 1.7–7.7)
Neutrophils Relative %: 69 %
Platelets: 262 10*3/uL (ref 150–400)
RBC: 5.4 MIL/uL (ref 4.22–5.81)
RDW: 12.1 % (ref 11.5–15.5)
WBC: 12.2 10*3/uL — ABNORMAL HIGH (ref 4.0–10.5)
nRBC: 0 % (ref 0.0–0.2)

## 2020-01-29 NOTE — ED Provider Notes (Addendum)
[facility] EMERGENCY DEPARTMENT Provider Note   CSN: [id number] Arrival date & time: 01/29/20  1737     History Chief Complaint  Patient presents with  . Chest Pain    Nicolas May is a 49 y.o. male.  Patient usually takes long walks every morning has not had any difficulty.  But starting yesterday has had some intermittent on and off chest discomfort substernal area lasting less than 15 minutes.  At most 10 minutes.  Does not seem to be exertional not made worse or better by anything else.  No significant shortness of breath no nausea or vomiting no leg swelling.  Past medical history since significant for sleep apnea and hypertension.  No known history of any heart problems.  No chest pain now.        Past Medical History:  Diagnosis Date  . Hypertension   . Sleep apnea     There are no problems to display for this patient.   History reviewed. No pertinent surgical history.     No family history on file.  Social History   Tobacco Use  . Smoking status: Never Smoker  . Smokeless tobacco: Never Used  Substance Use Topics  . Alcohol use: Yes  . Drug use: Never    Home Medications Prior to Admission medications   Medication Sig Start Date End Date Taking? Authorizing Provider  lisinopril (PRINIVIL,ZESTRIL) 20 MG tablet Take 20 mg by mouth daily.   Yes [provider]  cyclobenzaprine (FLEXERIL) 5 MG tablet Take 1 tablet (5 mg total) by mouth 3 (three) times daily as needed for muscle spasms. 07/29/15   Charlynne Pander, MD  Diclofenac Sodium CR (VOLTAREN-XR) 100 MG 24 hr tablet Take 1 tablet (100 mg total) by mouth daily. 04/06/18   Palumbo, April, MD    Allergies    Patient has no known allergies.  Review of Systems   Review of Systems  Constitutional: Negative for chills and fever.  HENT: Negative for congestion, rhinorrhea and sore throat.   Eyes: Negative for visual disturbance.  Respiratory: Negative for cough and shortness of breath.    Cardiovascular: Positive for chest pain. Negative for leg swelling.  Gastrointestinal: Negative for abdominal pain, diarrhea, nausea and vomiting.  Genitourinary: Negative for dysuria.  Musculoskeletal: Negative for back pain and neck pain.  Skin: Negative for rash.  Neurological: Negative for dizziness, light-headedness and headaches.  Hematological: Does not bruise/bleed easily.  Psychiatric/Behavioral: Negative for confusion.    Physical Exam Updated Vital Signs BP 124/73 (BP Location: Left Arm)   Pulse 72   Temp 98 F (36.7 C) (Oral)   Resp 14   Ht 1.803 m (5\' 11" )   Wt 104.3 kg   SpO2 97%   BMI 32.08 kg/m   Physical Exam Vitals and nursing note reviewed.  Constitutional:      Appearance: Normal appearance. He is well-developed.  HENT:     Head: Normocephalic and atraumatic.  Eyes:     Conjunctiva/sclera: Conjunctivae normal.     Pupils: Pupils are equal, round, and reactive to light.  Cardiovascular:     Rate and Rhythm: Normal rate and regular rhythm.     Heart sounds: No murmur.  Pulmonary:     Effort: Pulmonary effort is normal. No respiratory distress.     Breath sounds: Normal breath sounds.  Abdominal:     Palpations: Abdomen is soft.     Tenderness: There is no abdominal tenderness.  Musculoskeletal:  General: Normal range of motion.     Cervical back: Normal range of motion and neck supple.  Skin:    General: Skin is warm and dry.  Neurological:     General: No focal deficit present.     Mental Status: He is alert and oriented to person, place, and time.     ED Results / Procedures / Treatments   Labs (all labs ordered are listed, but only abnormal results are displayed) Labs Reviewed  COMPREHENSIVE METABOLIC PANEL - Abnormal; Notable for the following components:      Result Value   Glucose, Bld 108 (*)    All other components within normal limits  CBC WITH DIFFERENTIAL/PLATELET - Abnormal; Notable for the following components:   WBC  12.2 (*)    Neutro Abs 8.4 (*)    Abs Immature Granulocytes 0.10 (*)    All other components within normal limits  TROPONIN I (HIGH SENSITIVITY)  TROPONIN I (HIGH SENSITIVITY)    EKG EKG Interpretation  Date/Time:  Monday January 29 2020 17:40:42 EST Ventricular Rate:  80 PR Interval:  144 QRS Duration: 96 QT Interval:  376 QTC Calculation: 433 R Axis:   44 Text Interpretation: Normal sinus rhythm Normal ECG Confirmed by Fredia Sorrow [id number] on 01/29/2020 5:45:55 PM   Radiology DG Chest Port 1 View  Result Date: 01/29/2020 CLINICAL DATA:  49 year old male with central chest pain. EXAM: PORTABLE CHEST 1 VIEW COMPARISON:  Chest radiograph dated 07/29/2015. FINDINGS: The heart size and mediastinal contours are within normal limits. Both lungs are clear. The visualized skeletal structures are unremarkable. IMPRESSION: No active disease. Electronically Signed   By: Anner Crete M.D.   On: 01/29/2020 18:07    Procedures Procedures (including critical care time)  Medications Ordered in ED Medications - No data to display  ED Course  I have reviewed the triage vital signs and the nursing notes.  Pertinent labs & imaging results that were available during my care of the patient were reviewed by me and considered in my medical decision making (see chart for details).    MDM Rules/Calculators/A&P                      Work-up for the intermittent chest pain which is all been less than 10 minutes that started yesterday.  Without any acute findings chest x-ray negative troponins x2 -.  Would recommend patient start baby aspirin a day follow-up with primary care doctor precautions provided to return for any new or worse symptoms or chest pain lasting 15 minutes or longer.      Final Clinical Impression(s) / ED Diagnoses Final diagnoses:  Precordial pain    Rx / DC Orders ED Discharge Orders    None       Fredia Sorrow, MD 01/29/20 2044    Fredia Sorrow,  MD 01/29/20 2045

## 2020-01-29 NOTE — Discharge Instructions (Addendum)
Return for worse chest pain lasting 15 minutes or longer.  Otherwise make an appointment to follow-up with your doctor.  Today's evaluation without any acute findings.  Would recommend a baby aspirin a day.

## 2020-01-29 NOTE — ED Triage Notes (Signed)
Chest pain in the center of his chest on and off since yesterday.

## 2020-11-23 ENCOUNTER — Ambulatory Visit: Payer: BLUE CROSS/BLUE SHIELD | Attending: Internal Medicine

## 2020-11-23 DIAGNOSIS — Z23 Encounter for immunization: Secondary | ICD-10-CM

## 2020-11-23 NOTE — Progress Notes (Signed)
   Covid-19 Vaccination [facility]  Name:  Nicolas May    MRN: [id number] DOB: 1971/10/07  11/23/2020  Mr. Nicolas May was observed post Covid-19 immunization for 15 minutes without incident. He was provided with Vaccine Information Sheet and instruction to access the V-Safe system.   Mr. Nicolas May was instructed to call 911 with any severe reactions post vaccine: Marland Kitchen Difficulty breathing  . Swelling of face and throat  . A fast heartbeat  . A bad rash all over body  . Dizziness and weakness   Immunizations Administered    Name Date Dose VIS Date Route   Pfizer COVID-19 Vaccine 11/23/2020 12:33 PM 0.3 mL 10/09/2020 Intramuscular   Manufacturer: [employer], [employer]   Lot: [id number]   NDC: [address]-6

## 2020-12-06 ENCOUNTER — Ambulatory Visit: Payer: Self-pay

## 2021-10-16 IMAGING — DX DG CHEST 1V PORT
2 series · 2 of 2 positions shown · non-contrast
Comparison: Chest radiograph dated 07/29/2015.

CLINICAL DATA: 48-year-old male with central chest pain.

EXAM:
PORTABLE CHEST 1 VIEW

[chest ap (1 of 2)]
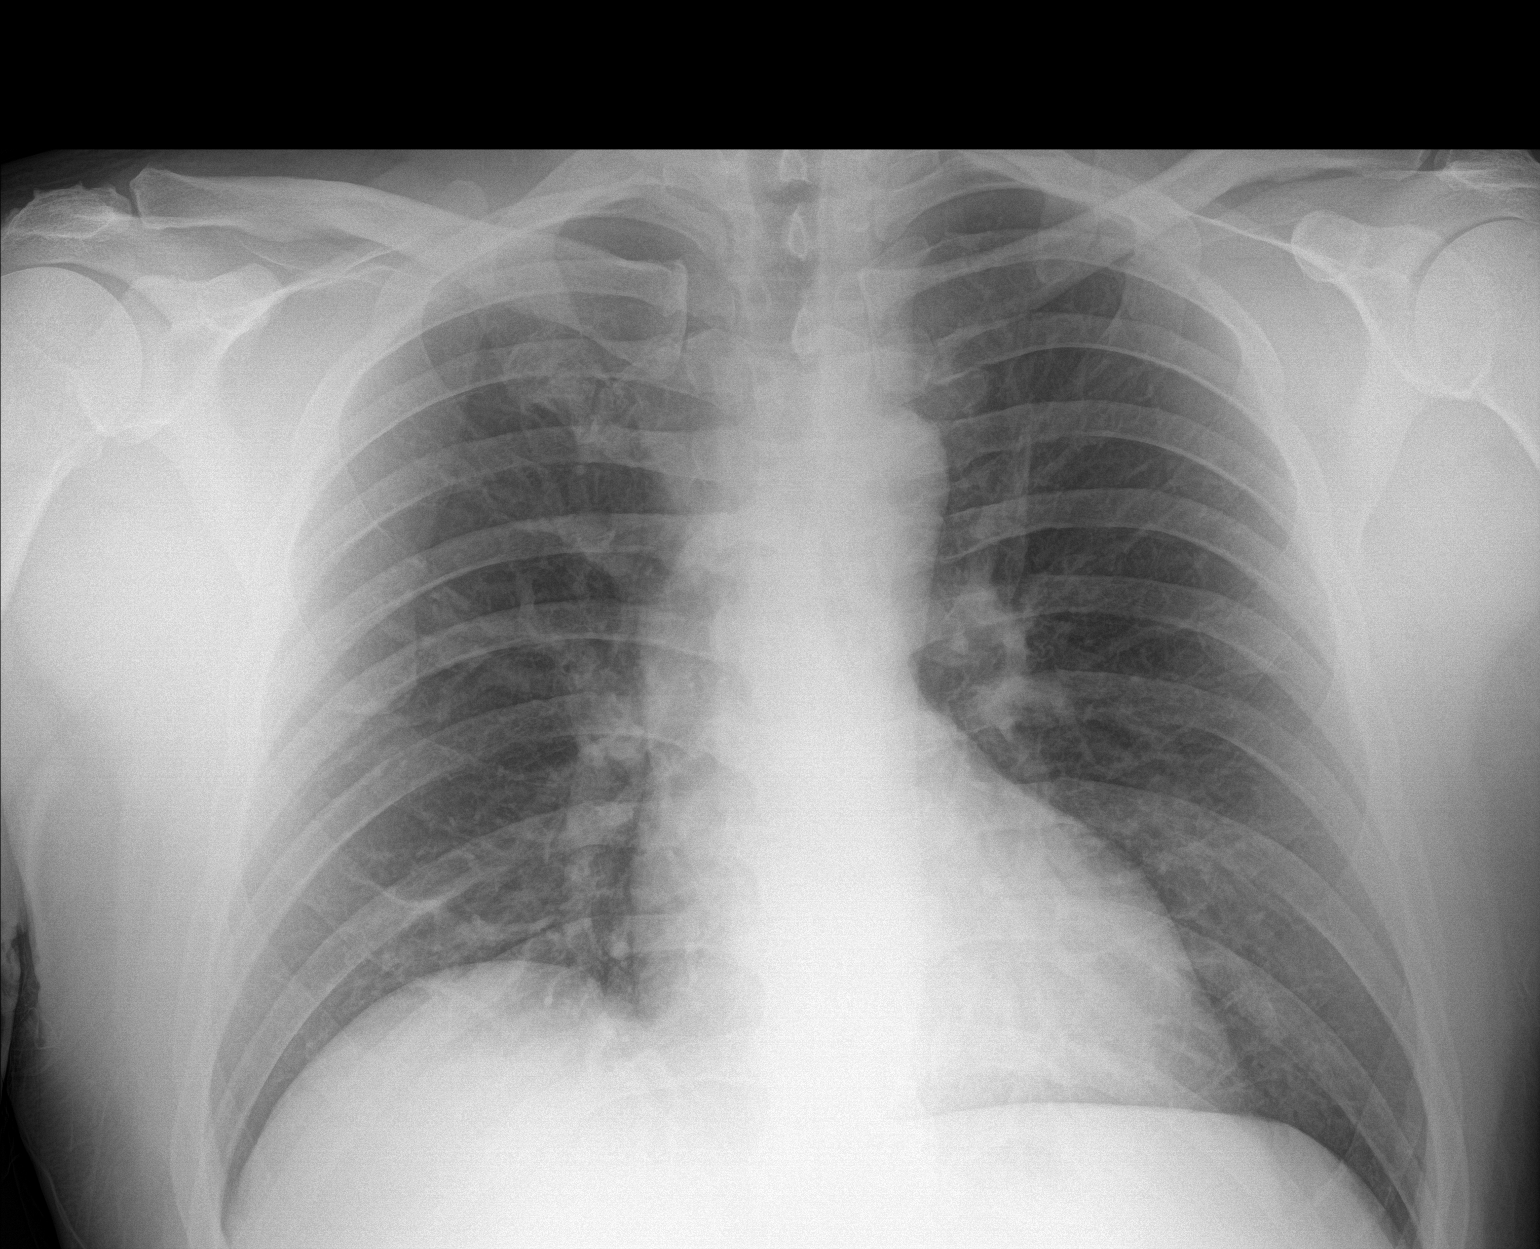

[chest ap (2 of 2)]
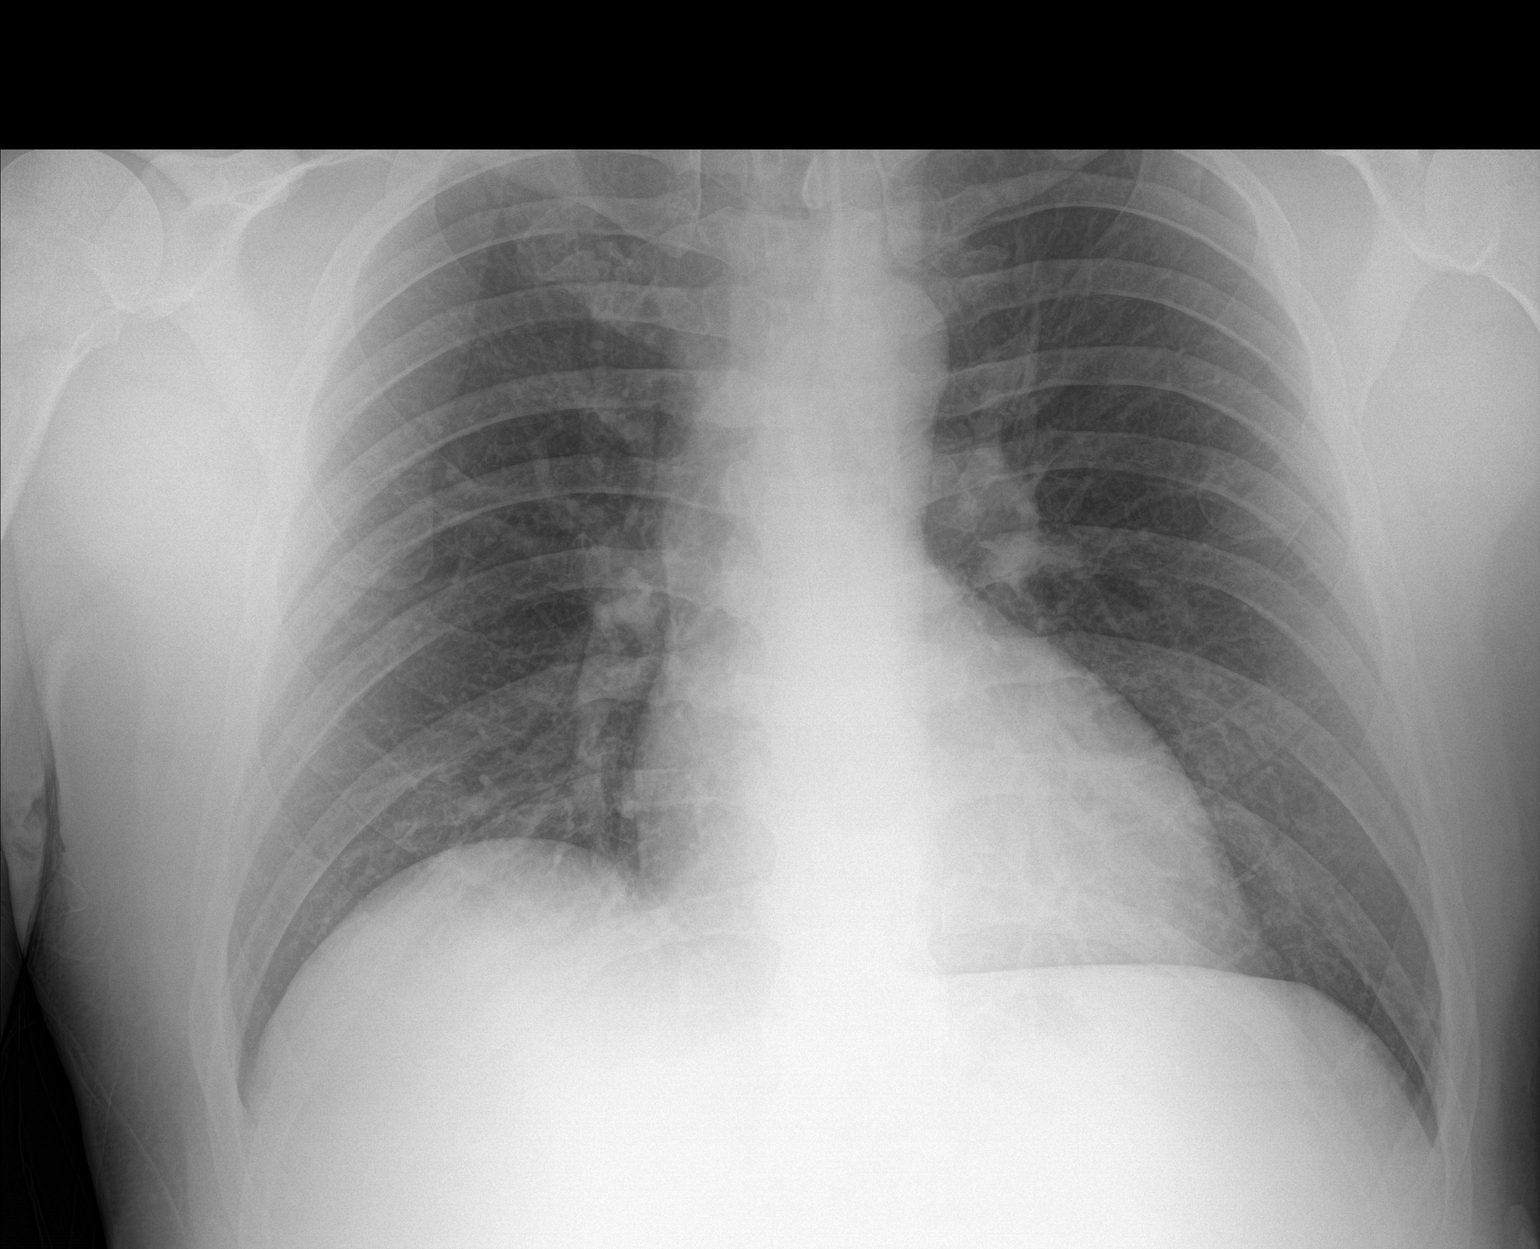

[2 of 2 positions shown; findings below may reference images not displayed]

FINDINGS: The heart size and mediastinal contours are within normal limits.
Both lungs are clear. The visualized skeletal structures are
unremarkable.
IMPRESSION: No active disease.

## 2022-08-03 ENCOUNTER — Ambulatory Visit: Payer: BLUE CROSS/BLUE SHIELD | Admitting: Podiatry

## 2022-09-17 ENCOUNTER — Encounter (HOSPITAL_BASED_OUTPATIENT_CLINIC_OR_DEPARTMENT_OTHER): Payer: Self-pay | Admitting: Emergency Medicine

## 2022-09-17 ENCOUNTER — Other Ambulatory Visit (HOSPITAL_BASED_OUTPATIENT_CLINIC_OR_DEPARTMENT_OTHER): Payer: Self-pay

## 2022-09-17 ENCOUNTER — Other Ambulatory Visit: Payer: Self-pay

## 2022-09-17 ENCOUNTER — Emergency Department (HOSPITAL_BASED_OUTPATIENT_CLINIC_OR_DEPARTMENT_OTHER)
Admission: EM | Admit: 2022-09-17 | Discharge: 2022-09-17 | Disposition: A | Discharge status: Home / Self Care | Payer: BLUE CROSS/BLUE SHIELD | Attending: Emergency Medicine | Admitting: Emergency Medicine

## 2022-09-17 DIAGNOSIS — M62838 Other muscle spasm: Secondary | ICD-10-CM | POA: Insufficient documentation

## 2022-09-17 DIAGNOSIS — Z79899 Other long term (current) drug therapy: Secondary | ICD-10-CM | POA: Diagnosis not present

## 2022-09-17 DIAGNOSIS — M79606 Pain in leg, unspecified: Secondary | ICD-10-CM | POA: Diagnosis not present

## 2022-09-17 MED ORDER — CYCLOBENZAPRINE HCL 5 MG PO TABS
5.0000 mg | ORAL_TABLET | Freq: Two times a day (BID) | ORAL | 0 refills | Status: AC | PRN
  Filled 2022-09-17: qty 20, 10d supply, fill #0

## 2022-09-17 NOTE — ED Triage Notes (Addendum)
Patient reports bilateral calf pain, twitching/spasms X several weeks. Saw PCP who has referred him to Neurology but cant get in until later next month. Presents here today b/c pain/spasms are worse.

## 2022-09-17 NOTE — ED Provider Notes (Signed)
[facility] EMERGENCY DEPARTMENT Provider Note   CSN: [id number] Arrival date & time: 09/17/22  1110     History  Chief Complaint  Patient presents with   Leg Pain    Nicolas May is a 51 y.o. male.  Patient here with ongoing muscle spasms in his calves bilaterally.  Ongoing for the last month.  Has neurology follow-up in a couple weeks.  Fairly avid bike rider.  Started to have calf cramps here recently.  Finished a course of steroids with not much improvement.  Feels like walking makes it feel better.  He has had discontinued bike riding now for about a week or so.  Denies any fever, chills, weakness, numbness.  Some intermittent back pain at times.  Denies any trauma.  Not sure what makes it worse or better.  The history is provided by the patient.       Home Medications Prior to Admission medications   Medication Sig Start Date End Date Taking? Authorizing Provider  cyclobenzaprine (FLEXERIL) 5 MG tablet Take 1 tablet (5 mg total) by mouth 2 (two) times daily as needed for up to 20 doses for muscle spasms. 09/17/22   Kyliah Deanda, DO  Diclofenac Sodium CR (VOLTAREN-XR) 100 MG 24 hr tablet Take 1 tablet (100 mg total) by mouth daily. 04/06/18   Palumbo, April, MD  lisinopril (PRINIVIL,ZESTRIL) 20 MG tablet Take 20 mg by mouth daily.    [provider]      Allergies    Valacyclovir    Review of Systems   Review of Systems  Physical Exam Updated Vital Signs BP (!) 167/92 (BP Location: Left Arm)   Pulse 75   Temp 98 F (36.7 C) (Oral)   Resp 16   Ht 5\' 11"  (1.803 m)   Wt 108 kg   SpO2 100%   BMI 33.19 kg/m  Physical Exam Vitals and nursing note reviewed.  Constitutional:      General: He is not in acute distress.    Appearance: He is well-developed.  HENT:     Head: Normocephalic and atraumatic.  Eyes:     Conjunctiva/sclera: Conjunctivae normal.  Cardiovascular:     Rate and Rhythm: Normal rate and regular rhythm.     Pulses: Normal  pulses.     Heart sounds: No murmur heard. Pulmonary:     Effort: Pulmonary effort is normal. No respiratory distress.     Breath sounds: Normal breath sounds.  Abdominal:     Palpations: Abdomen is soft.     Tenderness: There is no abdominal tenderness.  Musculoskeletal:        General: No swelling, tenderness, deformity or signs of injury. Normal range of motion.     Cervical back: Normal range of motion and neck supple.     Comments: No midline spinal tenderness  Skin:    General: Skin is warm and dry.     Capillary Refill: Capillary refill takes less than 2 seconds.  Neurological:     General: No focal deficit present.     Mental Status: He is alert.     Sensory: No sensory deficit.     Motor: No weakness.     Comments: 5+ out of 5 strength throughout, normal sensation  Psychiatric:        Mood and Affect: Mood normal.     ED Results / Procedures / Treatments   Labs (all labs ordered are listed, but only abnormal results are displayed) Labs Reviewed - No data  to display  EKG None  Radiology No results found.  Procedures Procedures    Medications Ordered in ED Medications - No data to display  ED Course/ Medical Decision Making/ A&P                           Medical Decision Making Risk Prescription drug management.   Elic Vencill is here with calf cramping.  Unremarkable vitals.  No fever.  Has had this on and off for the last month or so.  Had blood work done several days ago that were unremarkable including creatinine and electrolytes per my review and interpretation of recent labs.  He is not having any major symptoms now.  Differential diagnosis is likely muscle spasms, fasciculations, peripheral nerve process.  His neuro exam is normal.  He is got good pulses in his lower extremities.  Have no concern for DVT or peripheral arterial disease.  Could be calf strains.  I have no concern for cauda equina or any acute back issue.  We will put him on muscle  relaxants.  Recommend that he stretch daily and also use foam roller and try to work on his soft tissues in his calves.  Discharged in good condition.  Understands return precautions.  Will prescribe Flexeril.  This chart was dictated using voice recognition software.  Despite best efforts to proofread,  errors can occur which can change the documentation meaning.         Final Clinical Impression(s) / ED Diagnoses Final diagnoses:  Muscle spasm    Rx / DC Orders ED Discharge Orders          Ordered    cyclobenzaprine (FLEXERIL) 5 MG tablet  2 times daily PRN        09/17/22 1538              Mescal Flinchbaugh, DO 09/17/22 1541

## 2023-10-04 ENCOUNTER — Emergency Department (HOSPITAL_BASED_OUTPATIENT_CLINIC_OR_DEPARTMENT_OTHER): Payer: 59

## 2023-10-04 ENCOUNTER — Encounter (HOSPITAL_BASED_OUTPATIENT_CLINIC_OR_DEPARTMENT_OTHER): Payer: Self-pay

## 2023-10-04 ENCOUNTER — Other Ambulatory Visit: Payer: Self-pay

## 2023-10-04 ENCOUNTER — Emergency Department (HOSPITAL_BASED_OUTPATIENT_CLINIC_OR_DEPARTMENT_OTHER)
Admission: EM | Admit: 2023-10-04 | Discharge: 2023-10-04 | Disposition: A | Discharge status: Home / Self Care | Payer: 59 | Attending: Emergency Medicine | Admitting: Emergency Medicine

## 2023-10-04 DIAGNOSIS — R109 Unspecified abdominal pain: Secondary | ICD-10-CM

## 2023-10-04 DIAGNOSIS — M546 Pain in thoracic spine: Secondary | ICD-10-CM | POA: Diagnosis not present

## 2023-10-04 DIAGNOSIS — R1012 Left upper quadrant pain: Secondary | ICD-10-CM | POA: Diagnosis present

## 2023-10-04 LAB — COMPREHENSIVE METABOLIC PANEL
ALT: 29 U/L (ref 0–44)
AST: 24 U/L (ref 15–41)
Albumin: 4.2 g/dL (ref 3.5–5.0)
Alkaline Phosphatase: 51 U/L (ref 38–126)
Anion gap: 12 (ref 5–15)
BUN: 15 mg/dL (ref 6–20)
CO2: 24 mmol/L (ref 22–32)
Calcium: 9.6 mg/dL (ref 8.9–10.3)
Chloride: 101 mmol/L (ref 98–111)
Creatinine, Ser: 1.04 mg/dL (ref 0.61–1.24)
GFR, Estimated: >60 mL/min (ref >60)
Glucose, Bld: 145 mg/dL — ABNORMAL HIGH (ref 70–99)
Potassium: 4 mmol/L (ref 3.5–5.1)
Sodium: 137 mmol/L (ref 135–145)
Total Bilirubin: 1 mg/dL (ref 0.3–1.2)
Total Protein: 7.5 g/dL (ref 6.5–8.1)

## 2023-10-04 LAB — URINALYSIS, ROUTINE W REFLEX MICROSCOPIC
Bilirubin Urine: NEGATIVE
Glucose, UA: NEGATIVE mg/dL
Hgb urine dipstick: NEGATIVE
Ketones, ur: NEGATIVE mg/dL
Leukocytes,Ua: NEGATIVE
Nitrite: NEGATIVE
Protein, ur: NEGATIVE mg/dL
Specific Gravity, Urine: 1.025 (ref 1.005–1.030)
pH: 6 (ref 5.0–8.0)

## 2023-10-04 LAB — CBC
HCT: 46.8 % (ref 39.0–52.0)
Hemoglobin: 15.9 g/dL (ref 13.0–17.0)
MCH: 28.9 pg (ref 26.0–34.0)
MCHC: 34 g/dL (ref 30.0–36.0)
MCV: 84.9 fL (ref 80.0–100.0)
Platelets: 284 10*3/uL (ref 150–400)
RBC: 5.51 MIL/uL (ref 4.22–5.81)
RDW: 12.4 % (ref 11.5–15.5)
WBC: 13.8 10*3/uL — ABNORMAL HIGH (ref 4.0–10.5)
nRBC: 0 % (ref 0.0–0.2)

## 2023-10-04 LAB — LIPASE, BLOOD: Lipase: 65 U/L — ABNORMAL HIGH (ref 11–51)

## 2023-10-04 NOTE — Discharge Instructions (Signed)
Recommend 1000 mg of Tylenol every 6 hours as needed for pain.  Recommend 400 mg ibuprofen every 8 hours as needed for pain.  Return if symptoms worsen.

## 2023-10-04 NOTE — ED Triage Notes (Addendum)
Patient reports left sided abdominal pain that radiates to the back. Patient thought it was related to something he ate. Denies N/V, diarrhea or constipation. No urinary issues. Patient rates pain 5/10. Denies injury or trauma to the area.

## 2023-10-04 NOTE — ED Provider Notes (Signed)
Patient with CT scan that showed no acute findings.  Urinalysis negative for infection.  Pain seems to be mostly in the left flank/lower back.  Not having any chest pain or shortness of breath.  Pains mostly resolved.  There is no signs of constipation or bowel obstruction on the CT scan as well.  Pain seems muscular in nature.  Seems to be reproducible on exam.  May be gas related.  Possible that he passed a kidney stone his pain is not ongoing.  I have very low concern for any cardiac/pulmonary/abdominal process otherwise at this time.  He understands return precautions.  Discharged in good condition.  Recommend Tylenol and ibuprofen.  Follow-up with primary care doctor.  This chart was dictated using voice recognition software.  Despite best efforts to proofread,  errors can occur which can change the documentation meaning.    Virgina Norfolk, DO 10/04/23 [phone]

## 2023-10-04 NOTE — ED Provider Notes (Signed)
[facility]  Provider Note  CSN: [id number] Arrival date & time: 10/04/23 0557  History Chief Complaint  Patient presents with   Abdominal Pain    Nicolas May is a 52 y.o. male with no significant PMH reports mild-moderate aching L upper abdominal pain and L mid back pain since yesterday. No particular provoking or relieving factors. Had trouble getting comfortable overnight. No N/V/D or constipation. No dysuria or hematuria.    Home Medications Prior to Admission medications   Medication Sig Start Date End Date Taking? Authorizing Provider  lisinopril (PRINIVIL,ZESTRIL) 20 MG tablet Take 20 mg by mouth daily.   Yes [provider]  cyclobenzaprine (FLEXERIL) 5 MG tablet Take 1 tablet (5 mg total) by mouth 2 (two) times daily as needed for up to 20 doses for muscle spasms. 09/17/22   Curatolo, Adam, DO  Diclofenac Sodium CR (VOLTAREN-XR) 100 MG 24 hr tablet Take 1 tablet (100 mg total) by mouth daily. 04/06/18   Palumbo, April, MD     Allergies    Valacyclovir   Review of Systems   Review of Systems Please see HPI for pertinent positives and negatives  Physical Exam BP 120/82   Pulse 69   Temp 97.7 F (36.5 C) (Oral)   Resp 14   Ht 5\' 10"  (1.778 m)   Wt 104.3 kg   SpO2 97%   BMI 33.00 kg/m   Physical Exam Vitals and nursing note reviewed.  Constitutional:      Appearance: Normal appearance.  HENT:     Head: Normocephalic and atraumatic.     Nose: Nose normal.     Mouth/Throat:     Mouth: Mucous membranes are moist.  Eyes:     Extraocular Movements: Extraocular movements intact.     Conjunctiva/sclera: Conjunctivae normal.  Cardiovascular:     Rate and Rhythm: Normal rate.  Pulmonary:     Effort: Pulmonary effort is normal.     Breath sounds: Normal breath sounds.  Abdominal:     General: Abdomen is flat.     Palpations: Abdomen is soft.     Tenderness: There is no abdominal tenderness.   Musculoskeletal:        General: No swelling or tenderness. Normal range of motion.     Cervical back: Neck supple.  Skin:    General: Skin is warm and dry.  Neurological:     General: No focal deficit present.     Mental Status: He is alert.  Psychiatric:        Mood and Affect: Mood normal.     ED Results / Procedures / Treatments   EKG None  Procedures Procedures  Medications Ordered in the ED Medications - No data to display  Initial Impression and Plan  Patient here with LUQ abdominal pain and L flank pain. Not tender to palpation. Consider gastritis, renal colic, MSK pain. Will check labs and send for CT. Patient declines pain medication at this time.   ED Course   Clinical Course as of 10/04/23 0701  Mon Oct 04, 2023  0621 CBC with mild leukocytosis, otherwise normal.  [CS]  0640 CMP is normal. Lipase is borderline elevated.  [CS]  0700 Care of the patient signed out to the oncoming team at shift change.  [CS]    Clinical Course User Index [CS] Pollyann Savoy, MD     MDM Rules/Calculators/A&P Medical Decision Making Problems Addressed: Left upper quadrant abdominal pain: acute illness or  injury  Amount and/or Complexity of Data Reviewed Labs: ordered. Decision-making details documented in ED Course. Radiology: ordered.     Final Clinical Impression(s) / ED Diagnoses Final diagnoses:  Left upper quadrant abdominal pain    Rx / DC Orders ED Discharge Orders     None        Pollyann Savoy, MD 10/04/23 [phone]

## 2024-02-26 ENCOUNTER — Other Ambulatory Visit: Payer: Self-pay

## 2024-02-26 ENCOUNTER — Encounter (HOSPITAL_BASED_OUTPATIENT_CLINIC_OR_DEPARTMENT_OTHER): Payer: Self-pay | Admitting: Emergency Medicine

## 2024-02-26 DIAGNOSIS — J101 Influenza due to other identified influenza virus with other respiratory manifestations: Secondary | ICD-10-CM | POA: Insufficient documentation

## 2024-02-26 DIAGNOSIS — R059 Cough, unspecified: Secondary | ICD-10-CM | POA: Diagnosis present

## 2024-02-26 MED ORDER — ALBUTEROL SULFATE HFA 108 (90 BASE) MCG/ACT IN AERS
2.0000 | INHALATION_SPRAY | RESPIRATORY_TRACT | Status: DC | PRN
  Administered 2024-02-27: 2 via RESPIRATORY_TRACT
  Filled 2024-02-26 (×2): qty 6.7

## 2024-02-26 NOTE — ED Triage Notes (Signed)
 Pt recently dx with sinusitis but has had increasing drainage and congestion with heavy cough and SOB developing this evening. Pt states he feels very congested. O2 sat 97% on room air in triage.

## 2024-02-27 ENCOUNTER — Emergency Department (HOSPITAL_BASED_OUTPATIENT_CLINIC_OR_DEPARTMENT_OTHER)
Admission: EM | Admit: 2024-02-27 | Discharge: 2024-02-27 | Disposition: A | Discharge status: Home / Self Care | Attending: Emergency Medicine | Admitting: Emergency Medicine

## 2024-02-27 ENCOUNTER — Emergency Department (HOSPITAL_BASED_OUTPATIENT_CLINIC_OR_DEPARTMENT_OTHER)

## 2024-02-27 DIAGNOSIS — J101 Influenza due to other identified influenza virus with other respiratory manifestations: Secondary | ICD-10-CM

## 2024-02-27 LAB — RESP PANEL BY RT-PCR (RSV, FLU A&B, COVID)  RVPGX2
Influenza A by PCR: POSITIVE — AB
Influenza B by PCR: NEGATIVE
Resp Syncytial Virus by PCR: NEGATIVE
SARS Coronavirus 2 by RT PCR: NEGATIVE

## 2024-02-27 MED ORDER — BENZONATATE 100 MG PO CAPS
100.0000 mg | ORAL_CAPSULE | Freq: Once | ORAL | Status: AC
  Administered 2024-02-27: 100 mg via ORAL
  Filled 2024-02-27: qty 1

## 2024-02-27 MED ORDER — BENZONATATE 100 MG PO CAPS: 100.0000 mg | ORAL_CAPSULE | Freq: Three times a day (TID) | ORAL | 0 refills | Status: AC

## 2024-02-27 MED ORDER — PREDNISONE 10 MG PO TABS: 40.0000 mg | ORAL_TABLET | Freq: Every day | ORAL | 0 refills | Status: AC

## 2024-02-27 MED ORDER — IPRATROPIUM-ALBUTEROL 0.5-2.5 (3) MG/3ML IN SOLN: 3.0000 mL | Freq: Once | RESPIRATORY_TRACT | Status: AC

## 2024-02-27 MED ORDER — IPRATROPIUM-ALBUTEROL 0.5-2.5 (3) MG/3ML IN SOLN
RESPIRATORY_TRACT | Status: AC
  Administered 2024-02-27: 3 mL via RESPIRATORY_TRACT
  Filled 2024-02-27: qty 3

## 2024-02-27 MED ORDER — SALINE SPRAY 0.65 % NA SOLN: 1.0000 | NASAL | 0 refills | Status: AC | PRN

## 2024-02-27 MED ORDER — SALINE SPRAY 0.65 % NA SOLN
1.0000 | Freq: Once | NASAL | Status: AC
  Administered 2024-02-27: 1 via NASAL
  Filled 2024-02-27: qty 44

## 2024-02-27 NOTE — ED Provider Notes (Signed)
 [place] [facility] AT [facility] Provider Note   CSN: [id number] Arrival date & time: 02/26/24  2349     History  Chief Complaint  Patient presents with   Cough   Shortness of Breath    Nicolas May is a 53 y.o. male.   Cough Associated symptoms: shortness of breath   Shortness of Breath Associated symptoms: cough      53 year old male presenting to the emergency department with a chief complaint of sore throat and upper respiratory infectious symptoms.  The patient was initially seen for sore throat on 3//25.  He states he was recently diagnosed with sinusitis.  His chest tightness in the setting of shortness of breath which worsened this evening.  He denies any chest pain.  He feels very congested.  No fevers or chills.  He is tolerating oral intake.  Home Medications Prior to Admission medications   Medication Sig Start Date End Date Taking? Authorizing Provider  benzonatate (TESSALON) 100 MG capsule Take 1 capsule (100 mg total) by mouth every 8 (eight) hours. 02/27/24  Yes Ernie Avena, MD  predniSONE (DELTASONE) 10 MG tablet Take 4 tablets (40 mg total) by mouth daily for 5 days. 02/27/24 03/03/24 Yes Ernie Avena, MD  sodium chloride (OCEAN) 0.65 % SOLN nasal spray Place 1 spray into both nostrils as needed for congestion. 02/27/24  Yes Ernie Avena, MD  cyclobenzaprine (FLEXERIL) 5 MG tablet Take 1 tablet (5 mg total) by mouth 2 (two) times daily as needed for up to 20 doses for muscle spasms. 09/17/22   Curatolo, Adam, DO  Diclofenac Sodium CR (VOLTAREN-XR) 100 MG 24 hr tablet Take 1 tablet (100 mg total) by mouth daily. 04/06/18   Palumbo, April, MD  lisinopril (PRINIVIL,ZESTRIL) 20 MG tablet Take 20 mg by mouth daily.    [provider]      Allergies    Valacyclovir    Review of Systems   Review of Systems  Respiratory:  Positive for cough and shortness of breath.   All other systems reviewed and are negative.   Physical  Exam Updated Vital Signs BP 132/73 (BP Location: Right Arm)   Pulse 93   Temp 97.9 F (36.6 C) (Oral)   Resp 19   Ht 5\' 11"  (1.803 m)   Wt 106.6 kg   SpO2 96%   BMI 32.78 kg/m  Physical Exam Vitals and nursing note reviewed.  Constitutional:      General: He is not in acute distress.    Appearance: He is well-developed.  HENT:     Head: Normocephalic and atraumatic.  Eyes:     Conjunctiva/sclera: Conjunctivae normal.  Cardiovascular:     Rate and Rhythm: Normal rate and regular rhythm.     Heart sounds: No murmur heard. Pulmonary:     Effort: Pulmonary effort is normal. No respiratory distress.     Breath sounds: Examination of the right-lower field reveals rhonchi. Examination of the left-lower field reveals rhonchi. Wheezing and rhonchi present.  Abdominal:     Palpations: Abdomen is soft.     Tenderness: There is no abdominal tenderness.  Musculoskeletal:        General: No swelling.     Cervical back: Neck supple.  Skin:    General: Skin is warm and dry.     Capillary Refill: Capillary refill takes less than 2 seconds.  Neurological:     Mental Status: He is alert.  Psychiatric:        Mood  and Affect: Mood normal.     ED Results / Procedures / Treatments   Labs (all labs ordered are listed, but only abnormal results are displayed) Labs Reviewed  RESP PANEL BY RT-PCR (RSV, FLU A&B, COVID)  RVPGX2 - Abnormal; Notable for the following components:      Result Value   Influenza A by PCR POSITIVE (*)    All other components within normal limits    EKG EKG Interpretation Date/Time:  Sunday February 27 2024 00:02:04 EST Ventricular Rate:  84 PR Interval:  146 QRS Duration:  88 QT Interval:  368 QTC Calculation: 434 R Axis:   83  Text Interpretation: Normal sinus rhythm Normal ECG When compared with ECG of 29-Jan-2020 17:40, PREVIOUS ECG IS PRESENT Confirmed by Ernie Avena ([id number]) on 02/27/2024 1:35:15 AM  Radiology DG Chest 2 View Result Date:  02/27/2024 CLINICAL DATA:  Cough and shortness of breath with recent diagnosis of sinusitis. EXAM: CHEST - 2 VIEW COMPARISON:  January 29, 2020 FINDINGS: The heart size and mediastinal contours are within normal limits. Both lungs are clear. The visualized skeletal structures are unremarkable. IMPRESSION: No active cardiopulmonary disease. Electronically Signed   By: Aram Candela M.D.   On: 02/27/2024 00:52    Procedures Procedures    Medications Ordered in ED Medications  albuterol (VENTOLIN HFA) 108 (90 Base) MCG/ACT inhaler 2 puff (2 puffs Inhalation Given 02/27/24 0154)  benzonatate (TESSALON) capsule 100 mg (100 mg Oral Given 02/27/24 0156)  sodium chloride (OCEAN) 0.65 % nasal spray 1 spray (1 spray Each Nare Given 02/27/24 0156)  ipratropium-albuterol (DUONEB) 0.5-2.5 (3) MG/3ML nebulizer solution 3 mL (3 mLs Nebulization Given 02/27/24 0305)    ED Course/ Medical Decision Making/ A&P Clinical Course as of 02/27/24 1610  Wynelle Link Feb 27, 2024  0135 Influenza A By PCR(!): POSITIVE [JL]    Clinical Course User Index [JL] Ernie Avena, MD                                 Medical Decision Making Amount and/or Complexity of Data Reviewed Labs:  Decision-making details documented in ED Course. Radiology: ordered.  Risk OTC drugs. Prescription drug management.    53 year old male presenting to the emergency department with a chief complaint of sore throat and upper respiratory infectious symptoms.  The patient was initially seen for sore throat on 3//25.  He states he was recently diagnosed with sinusitis.  His chest tightness in the setting of shortness of breath which worsened this evening.  He denies any chest pain.  He feels very congested.  No fevers or chills.  He is tolerating oral intake.  On arrival, the patient was vitally stable, physical exam with mild rhonchi bilaterally.  Minimal wheezing present.  Patient administered albuterol inhaler as well as a nebulizer treatment.   Symptoms of sore throat and flulike symptoms have been ongoing for the past few days.  Outside the window for Tamiflu at this time.  Advised symptomatic management with Tylenol and ibuprofen. An EKG was performed revealed sinus rhythm, ventricular rate 84, no acute ischemic changes.  A chest x-ray revealed clear lungs with no acute cardiac or pulmonary disease.  Very low concern for ACS, PE, other acute intrathoracic abnormality in the state of the patient being positive for influenza. Will discharge also with a course of prednisone, Tessalon, nasal saline for decongestion.  Advised intermittent Afrin for no more than 3 days as well for  decongestion.  Overall stable for discharge at this time   Final Clinical Impression(s) / ED Diagnoses Final diagnoses:  Influenza A    Rx / DC Orders ED Discharge Orders          Ordered    predniSONE (DELTASONE) 10 MG tablet  Daily        02/27/24 0300    benzonatate (TESSALON) 100 MG capsule  Every 8 hours        02/27/24 0300    sodium chloride (OCEAN) 0.65 % SOLN nasal spray  As needed        02/27/24 0300              Ernie Avena, MD 02/27/24 [phone]

## 2024-02-27 NOTE — Discharge Instructions (Addendum)
 You tested positive for influenza for which we will treat with cough suppressant medication, steroid as well as nasal saline spray for decongestion.  Can also try a short course of Afrin over-the-counter but do not take more than 3 days in a row.  Additionally, given the duration of symptoms you are outside the window for Tamiflu.  Your chest x-ray showed clear lungs without evidence of pneumonia or ruptured lung.  Return for any severe worsening symptoms.

## 2024-04-14 ENCOUNTER — Ambulatory Visit: Admitting: Family Medicine
